# Patient Record
Sex: Male | Born: 1985 | Hispanic: No | Marital: Single | Smoking: Never smoker
Health system: Southern US, Community
[De-identification: ages and names within clinical notes are randomized; demographics above are authoritative.]

## PROBLEM LIST (undated history)

## (undated) DIAGNOSIS — R569 Unspecified convulsions: Secondary | ICD-10-CM

---

## 2016-11-19 ENCOUNTER — Emergency Department
Admission: EM | Admit: 2016-11-19 | Discharge: 2016-11-19 | Disposition: A | Discharge status: Home / Self Care | Payer: Self-pay | Attending: Emergency Medicine | Admitting: Emergency Medicine

## 2016-11-19 ENCOUNTER — Emergency Department: Payer: Self-pay

## 2016-11-19 DIAGNOSIS — R569 Unspecified convulsions: Secondary | ICD-10-CM

## 2016-11-19 DIAGNOSIS — G40909 Epilepsy, unspecified, not intractable, without status epilepticus: Secondary | ICD-10-CM | POA: Insufficient documentation

## 2016-11-19 DIAGNOSIS — F1210 Cannabis abuse, uncomplicated: Secondary | ICD-10-CM | POA: Insufficient documentation

## 2016-11-19 HISTORY — DX: Unspecified convulsions: R56.9

## 2016-11-19 LAB — URINALYSIS, COMPLETE (UACMP) WITH MICROSCOPIC
BACTERIA UA: NONE SEEN
Bilirubin Urine: NEGATIVE
Glucose, UA: NEGATIVE mg/dL
Ketones, ur: 20 mg/dL — AB
Leukocytes, UA: NEGATIVE
Nitrite: NEGATIVE
PROTEIN: 100 mg/dL — AB
RBC / HPF: NONE SEEN RBC/hpf (ref 0–5)
SQUAMOUS EPITHELIAL / LPF: NONE SEEN
Specific Gravity, Urine: 1.016 (ref 1.005–1.030)
pH: 5 (ref 5.0–8.0)

## 2016-11-19 LAB — URINE DRUG SCREEN, QUALITATIVE (ARMC ONLY)
Amphetamines, Ur Screen: NOT DETECTED
BARBITURATES, UR SCREEN: NOT DETECTED
Benzodiazepine, Ur Scrn: POSITIVE — AB
COCAINE METABOLITE, UR ~~LOC~~: NOT DETECTED
Cannabinoid 50 Ng, Ur ~~LOC~~: POSITIVE — AB
MDMA (ECSTASY) UR SCREEN: NOT DETECTED
Methadone Scn, Ur: NOT DETECTED
OPIATE, UR SCREEN: NOT DETECTED
PHENCYCLIDINE (PCP) UR S: NOT DETECTED
Tricyclic, Ur Screen: NOT DETECTED

## 2016-11-19 LAB — CBC WITH DIFFERENTIAL/PLATELET
Basophils Absolute: 0.1 10*3/uL (ref 0–0.1)
Basophils Relative: 1 %
EOS ABS: 0 10*3/uL (ref 0–0.7)
Eosinophils Relative: 0 %
HCT: 42.9 % (ref 40.0–52.0)
HEMOGLOBIN: 14 g/dL (ref 13.0–18.0)
LYMPHS ABS: 5.2 10*3/uL — AB (ref 1.0–3.6)
LYMPHS PCT: 42 %
MCH: 27.9 pg (ref 26.0–34.0)
MCHC: 32.6 g/dL (ref 32.0–36.0)
MCV: 85.5 fL (ref 80.0–100.0)
Monocytes Absolute: 0.8 10*3/uL (ref 0.2–1.0)
Monocytes Relative: 7 %
NEUTROS PCT: 50 %
Neutro Abs: 6.3 10*3/uL (ref 1.4–6.5)
Platelets: 266 10*3/uL (ref 150–440)
RBC: 5.02 MIL/uL (ref 4.40–5.90)
RDW: 13.2 % (ref 11.5–14.5)
WBC: 12.4 10*3/uL — ABNORMAL HIGH (ref 3.8–10.6)

## 2016-11-19 LAB — COMPREHENSIVE METABOLIC PANEL
ALT: 22 U/L (ref 17–63)
AST: 44 U/L — AB (ref 15–41)
Albumin: 4.9 g/dL (ref 3.5–5.0)
Alkaline Phosphatase: 66 U/L (ref 38–126)
Anion gap: 20 — ABNORMAL HIGH (ref 5–15)
BUN: 15 mg/dL (ref 6–20)
CHLORIDE: 106 mmol/L (ref 101–111)
CO2: 10 mmol/L — AB (ref 22–32)
CREATININE: 0.95 mg/dL (ref 0.61–1.24)
Calcium: 9 mg/dL (ref 8.9–10.3)
GFR calc non Af Amer: >60 mL/min (ref >60)
Glucose, Bld: 112 mg/dL — ABNORMAL HIGH (ref 65–99)
POTASSIUM: 3.5 mmol/L (ref 3.5–5.1)
SODIUM: 136 mmol/L (ref 135–145)
Total Bilirubin: 0.7 mg/dL (ref 0.3–1.2)
Total Protein: 8.3 g/dL — ABNORMAL HIGH (ref 6.5–8.1)

## 2016-11-19 LAB — GLUCOSE, CAPILLARY: Glucose-Capillary: 126 mg/dL — ABNORMAL HIGH (ref 65–99)

## 2016-11-19 LAB — ETHANOL: ALCOHOL ETHYL (B): 6 mg/dL — AB (ref <5)

## 2016-11-19 LAB — PHENYTOIN LEVEL, TOTAL

## 2016-11-19 MED ORDER — PHENYTOIN SODIUM EXTENDED 100 MG PO CAPS: 100.0000 mg | ORAL_CAPSULE | Freq: Three times a day (TID) | ORAL | 2 refills | Status: DC

## 2016-11-19 MED ORDER — PHENYTOIN SODIUM EXTENDED 100 MG PO CAPS: 100.0000 mg | ORAL_CAPSULE | Freq: Three times a day (TID) | ORAL | 1 refills | Status: AC

## 2016-11-19 MED ORDER — SODIUM CHLORIDE 0.9 % IV SOLN
1000.0000 mg | Freq: Once | INTRAVENOUS | Status: AC
  Administered 2016-11-19: 1000 mg via INTRAVENOUS
  Filled 2016-11-19: qty 20

## 2016-11-19 MED ORDER — SODIUM CHLORIDE 0.9 % IV BOLUS (SEPSIS)
1000.0000 mL | Freq: Once | INTRAVENOUS | Status: AC
  Administered 2016-11-19: 1000 mL via INTRAVENOUS

## 2016-11-19 NOTE — ED Notes (Signed)
Dr Alphonzo LemmingsMcShane talked to pharmacy about Dilantin

## 2016-11-19 NOTE — ED Triage Notes (Signed)
Pt arrived via ems - he was at work and fell in the kitchen having a seizure - ems witnessed seizure activity and gave 2.5mg  of versed - at this time pt is post dictal but will respond to questions if asked and follow commands - pt has history of seizures but has not been taking medication - pt c/o right sided head pain

## 2016-11-19 NOTE — ED Notes (Signed)
Dr Alphonzo LemmingsMcShane is ok waiting on urine sample until pt is more alert and can void on his own
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
## 2016-11-19 NOTE — ED Notes (Signed)
Iv d'ced.  D/c inst to pt.  Pt signed esignature for discharge instructions.  Pt alert.  Family with pt.

## 2016-11-19 NOTE — ED Notes (Signed)
Pharmacy approved IV tubing to infuse Dilantin

## 2016-11-19 NOTE — ED Provider Notes (Signed)
[facility]  I accepted care from Dr. Alphonzo LemmingsMcShane ____________________________________________    LABS (pertinent positives/negatives)  Labs Reviewed  GLUCOSE, CAPILLARY - Abnormal; Notable for the following:       Result Value   Glucose-Capillary 126 (*)    All other components within normal limits  ETHANOL - Abnormal; Notable for the following:    Alcohol, Ethyl (B) 6 (*)    All other components within normal limits  COMPREHENSIVE METABOLIC PANEL - Abnormal; Notable for the following:    CO2 10 (*)    Glucose, Bld 112 (*)    Total Protein 8.3 (*)    AST 44 (*)    Anion gap 20 (*)    All other components within normal limits  CBC WITH DIFFERENTIAL/PLATELET - Abnormal; Notable for the following:    WBC 12.4 (*)    Lymphs Abs 5.2 (*)    All other components within normal limits  URINE DRUG SCREEN, QUALITATIVE ([facility] ONLY) - Abnormal; Notable for the following:    Cannabinoid 50 Ng, Ur [place] POSITIVE (*)    Benzodiazepine, Ur Scrn POSITIVE (*)    All other components within normal limits  URINALYSIS, COMPLETE (UACMP) WITH MICROSCOPIC - Abnormal; Notable for the following:    Color, Urine YELLOW (*)    APPearance CLEAR (*)    Hgb urine dipstick SMALL (*)    Ketones, ur 20 (*)    Protein, ur 100 (*)    All other components within normal limits  PHENYTOIN LEVEL, TOTAL - Abnormal; Notable for the following:    Phenytoin Lvl <2.5 (*)    All other components within normal limits       ____________________________________________   PROCEDURES  Procedure(s) performed: None  Critical Care performed: None  ____________________________________________   INITIAL IMPRESSION / ASSESSMENT AND PLAN / ED COURSE   Pertinent labs & imaging results that were available during my care of the patient were reviewed by me and considered in my medical decision making (see chart for details).  I was signed out this patient, awaiting urinalysis and urine drug screen.  Patient had received a loading dose of Dilantin and printed prescription to start on Dilantin by Dr. Alphonzo LemmingsMcShane. Patient will be referred both for neurology as well as primary care follow-up.  I did discuss with him urine drug screen positive for benzodiazepine as well as cannabis and to avoid these substances as they can lower seizure threshold.  We also discussed seizure precautions.  CONSULTATIONS: None   Patient / Family / Caregiver informed of clinical course, medical decision-making process, and agree with plan.   I discussed return precautions, follow-up instructions, and discharged instructions with patient and/or family.     ____________________________________________   FINAL CLINICAL IMPRESSION(S) / ED DIAGNOSES  Final diagnoses:  Seizure (HCC)  Cannabis abuse        Governor Rooksebecca Michaline Kindig, MD 11/19/16 1737

## 2016-11-19 NOTE — ED Provider Notes (Addendum)
[facility] Emergency Department Provider Note  ____________________________________________   I have reviewed the triage vital signs and the nursing notes.   HISTORY  Chief Complaint Seizures    HPI Jacob Hines is a 31 y.o. male who presents today complaining of having had a seizure. According to EMS patient does have a known history of seizures. Patient is able to tell me that much himself. He does state that he supposed to be taking Dilantin but not taking it. He denies any alcohol abuse. Very limited history as he is very sleepy and had Versed prior to coming in. It was a witnessed grand mal seizure. Some evidence of that he did hit his head is present. Patient was complaining of EMS for headaches but he states he is not having one at this time to me.       Past Medical History:  Diagnosis Date  . Seizures (HCC)     There are no active problems to display for this patient.   History reviewed. No pertinent surgical history.  Prior to Admission medications   Not on File    Allergies Patient has no known allergies.  No family history on file.  Social History Social History  Substance Use Topics  . Smoking status: Never Smoker  . Smokeless tobacco: Never Used  . Alcohol use No    Review of Systems Level 5 chart caveat; no further history available due to patient status. ____________________________________________   PHYSICAL EXAM:  VITAL SIGNS: ED Triage Vitals  Enc Vitals Group     BP 11/19/16 1337 119/64     Pulse Rate 11/19/16 1337 (!) 116     Resp 11/19/16 1337 (!) 22     Temp 11/19/16 1337 98.4 F (36.9 C)     Temp Source 11/19/16 1337 Axillary     SpO2 11/19/16 1337 96 %     Weight 11/19/16 1328 140 lb (63.5 kg)     Height 11/19/16 1328 5\' 4"  (1.626 m)     Head Circumference --      Peak Flow --      Pain Score 11/19/16 1328 6     Pain Loc --      Pain Edu? --      Excl. in GC? --     Constitutional: Low but  able to answer questions limitedly. Appears postictal. Well appearing and in no acute distress. Eyes: Conjunctivae are normal. PERRL. EOMI. Head: Positive scalp hematoma, no bleeding laceration. Nose: No congestion/rhinnorhea. Mouth/Throat: Mucous membranes are moist.  Oropharynx non-erythematous. Neck: No stridor.   Nontender with no meningismus Cardiovascular: Mild tachycardia noted, regular rhythm. Grossly normal heart sounds.  Good peripheral circulation. Respiratory: Normal respiratory effort.  No retractions. Lungs CTAB. Abdominal: Soft and nontender. No distention. No guarding no rebound Back:  There is no focal tenderness or step off.  there is no midline tenderness there are no lesions noted. there is no CVA tenderness Musculoskeletal: No lower extremity tenderness, no upper extremity tenderness. No joint effusions, no DVT signs strong distal pulses no edema Neurologic:  No obvious focality to his exam the patient is noncompliant with exam. Skin:  Skin is warm, dry and intact. No rash noted. Psychiatric: Mood and affect are normal. Speech and behavior are normal.  ____________________________________________   LABS (all labs ordered are listed, but only abnormal results are displayed)  Labs Reviewed  GLUCOSE, CAPILLARY - Abnormal; Notable for the following:       Result Value   Glucose-Capillary  126 (*)    All other components within normal limits  ETHANOL - Abnormal; Notable for the following:    Alcohol, Ethyl (B) 6 (*)    All other components within normal limits  COMPREHENSIVE METABOLIC PANEL - Abnormal; Notable for the following:    CO2 10 (*)    Glucose, Bld 112 (*)    Total Protein 8.3 (*)    AST 44 (*)    Anion gap 20 (*)    All other components within normal limits  CBC WITH DIFFERENTIAL/PLATELET - Abnormal; Notable for the following:    WBC 12.4 (*)    Lymphs Abs 5.2 (*)    All other components within normal limits  URINE DRUG SCREEN, QUALITATIVE ([facility] ONLY)   URINALYSIS, COMPLETE (UACMP) WITH MICROSCOPIC  PHENYTOIN LEVEL, TOTAL   ____________________________________________  EKG  I personally interpreted any EKGs ordered by me or triage Sinus tachycardia rate 133 bpm, no acute ST elevation or depression. ____________________________________________  RADIOLOGY  I reviewed any imaging ordered by me or triage that were performed during my shift and, if possible, patient and/or family made aware of any abnormal findings. ____________________________________________   PROCEDURES  Procedure(s) performed: None  Procedures  Critical Care performed: None  ____________________________________________   INITIAL IMPRESSION / ASSESSMENT AND PLAN / ED COURSE  Pertinent labs & imaging results that were available during my care of the patient were reviewed by me and considered in my medical decision making (see chart for details).  Patient here with seizure, known seizure history. Dilantin levels pending. Did get Versed. We'll need further evaluation as he wakes up. Because he apparently just moved here from a fall and I have no idea if he's ever had any imaging for his seizure disorder we did do a CT scan of the head especially as he has some evidence of head injury, and CT is reassuringly negative and I did discuss with radiology. Patient therefore is at this time awaiting his Dilantin level which we will supplement as needed. I do not think he is a position to give me a reliable history and I would need to give him Dilantin pending his level. His blood work betray some degree of acidemia which is not unusual for a postictal period. We will give him IV fluids, and we'll continue to observe him.   ----------------------------------------- 2:32 PM on 11/19/2016 -----------------------------------------  Patient is awake and alert using his cell phone in no acute distress neurologically intact, he states he used to be on Dilantin but has not  taken it for a year. We will peripherally therefore Dilantin load him. We will continue to observe him here until after he is restarted on Dilantin and we'll have him follow-up with neurology. Patient very comfortable with this. Counseled the patient not to drive, soak in the tub, or do any other activities which, if interrupted by seizure, can cause him and other people harm and he understands and agrees.   ----------------------------------------- 3:06 PM on 11/19/2016 -----------------------------------------  Signed out to dr. Shaune Pollack at the end of my shift pending dilantin load. Pt in nad laughing and joking in the room.     ____________________________________________   FINAL CLINICAL IMPRESSION(S) / ED DIAGNOSES  Final diagnoses:  None      This chart was dictated using voice recognition software.  Despite best efforts to proofread,  errors can occur which can change meaning.      Jeanmarie Plant, MD 11/19/16 1425    Jeanmarie Plant, MD 11/19/16  1433    Jeanmarie PlantJames A McShane, MD 11/19/16 [phone]

## 2016-11-19 NOTE — Discharge Instructions (Signed)
Please follow up with a primary care doctor as well as neurologist. Dr. Alphonzo LemmingsMcShane has started back on Dilantin. You will need to be rechecked for this to make sure the dose is correct. Avoid any illegal substances including marijuana as this can lower the seizure threshold and make it more likely for you to have a seizure.  No driving or operating machinery, unattended swimming or baths, or climbing to heights until seen and cleared by neurologist.

## 2017-11-05 IMAGING — CT CT HEAD W/O CM
3 series · 15 of 47 positions shown, 18 images · non-contrast
Comparison: None.

CLINICAL DATA: Seizure with fall

EXAM:
CT HEAD WITHOUT CONTRAST
TECHNIQUE: Contiguous axial images were obtained from the base of the skull
through the vertex without intravenous contrast.

[Series 2: head wo · axial · 0.43mm/px · z∈[+87,+217]mm · 9 of 32 slices shown, 12 images]
[im 3/32  brain]
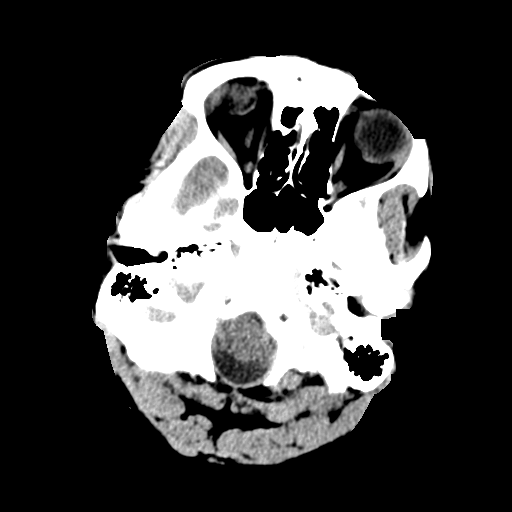
[im 3/32  bone]
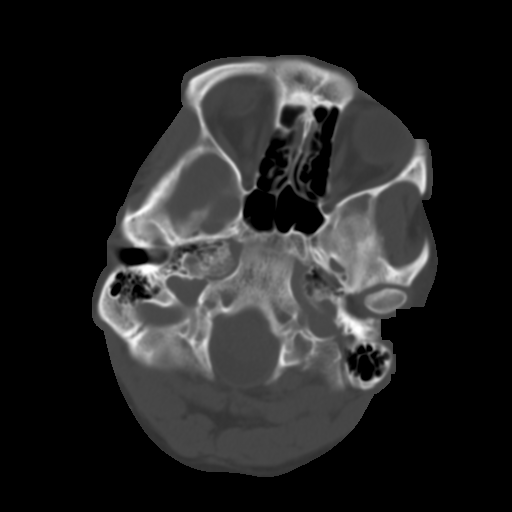
[im 6/32  brain]
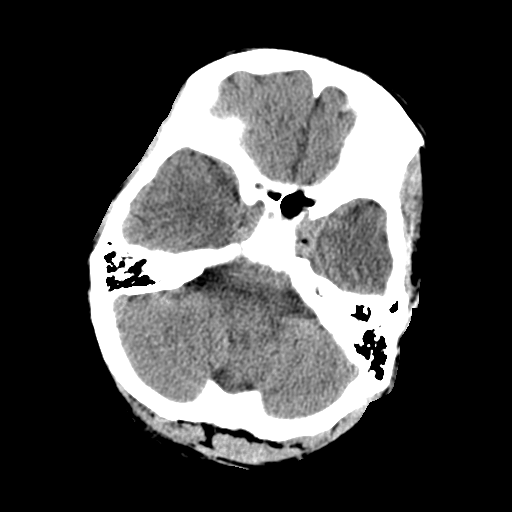
[im 9/32  brain]
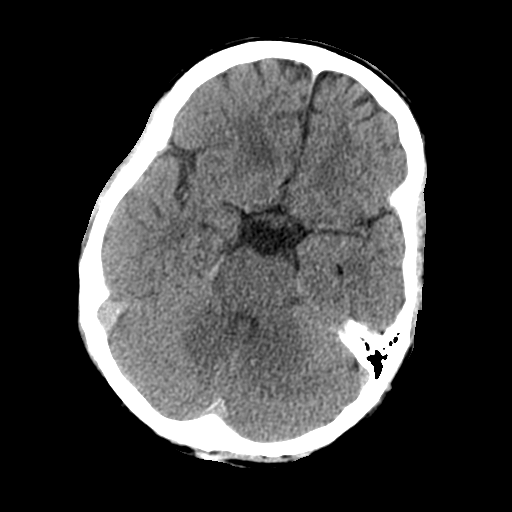
[im 12/32  brain]
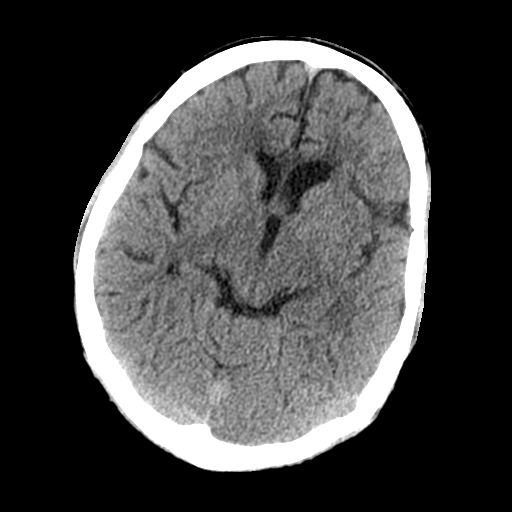
[im 17/32  brain]
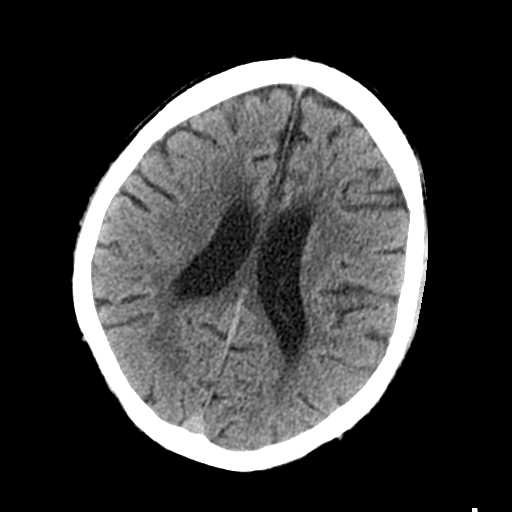
[im 17/32  bone]
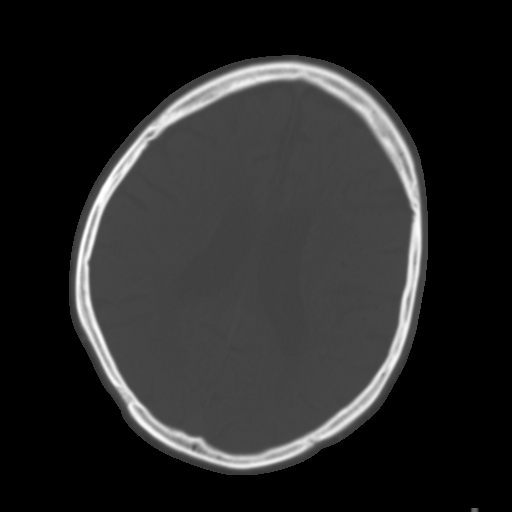
[im 20/32  brain]
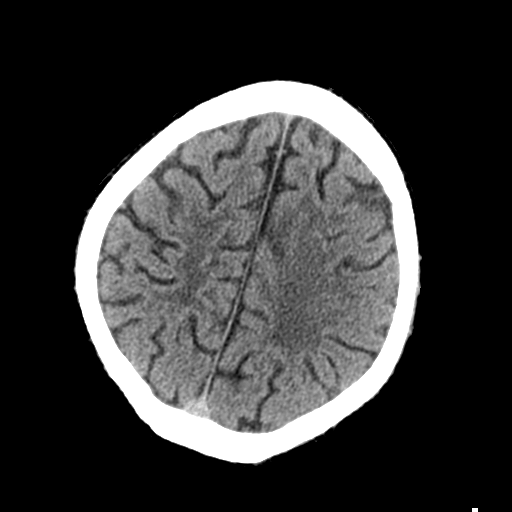
[im 23/32  brain]
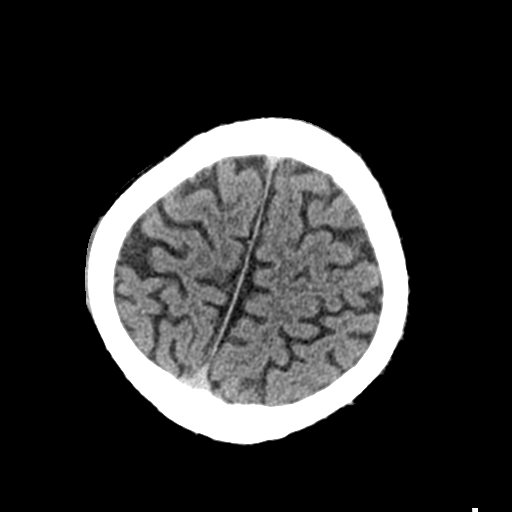
[im 26/32  brain]
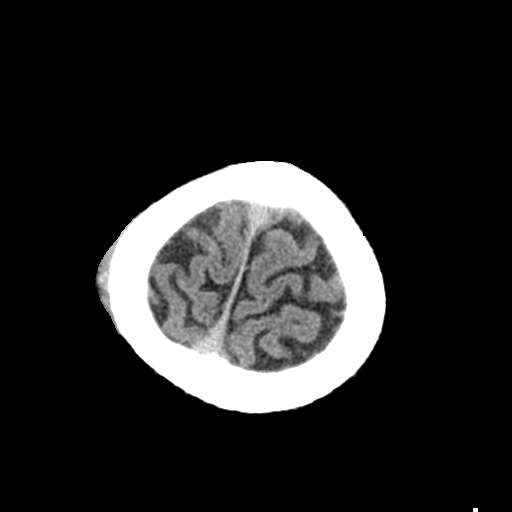
[im 29/32  brain]
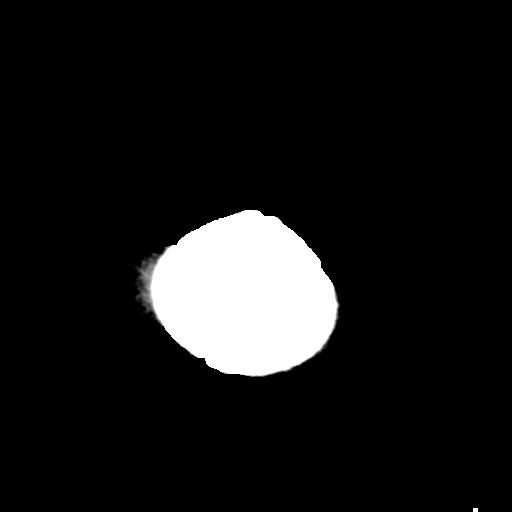
[im 29/32  bone]
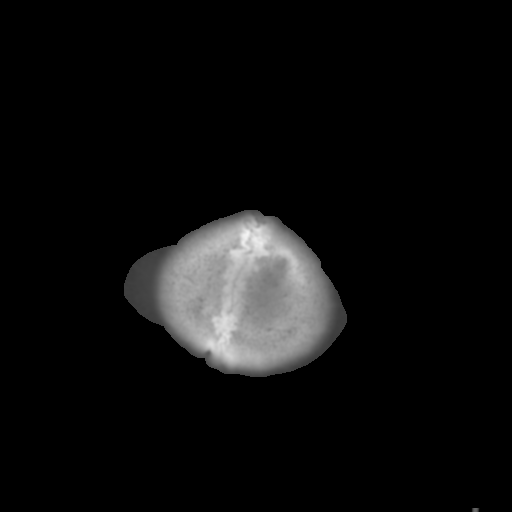

[Series 4: coronal soft tissue · coronal · 0.33mm/px · 3 of 67 slices shown]
[im 23/67  brain]
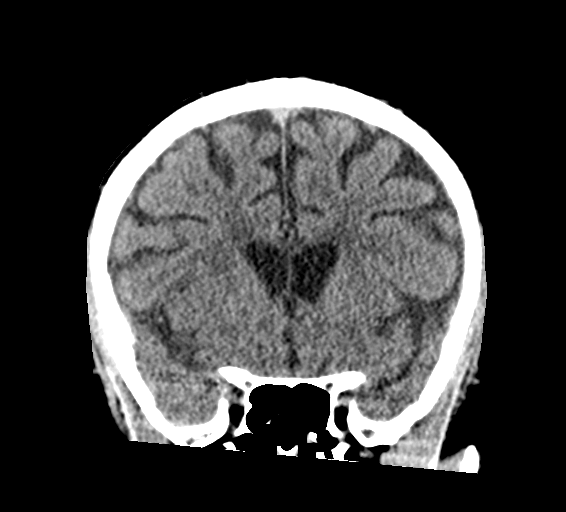
[im 30/67  brain]
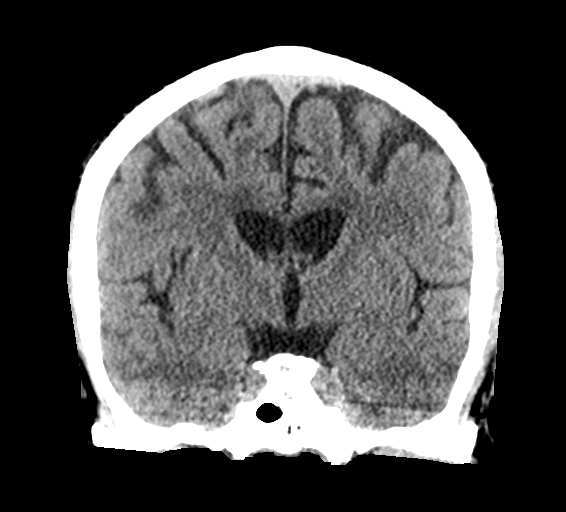
[im 37/67  brain]
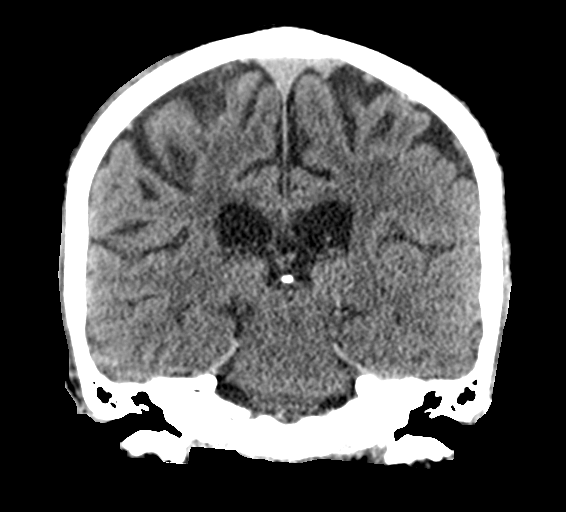

[Series 5: sagittal soft tissue · sagittal · 0.34mm/px · 3 of 56 slices shown]
[im 19/56  brain]
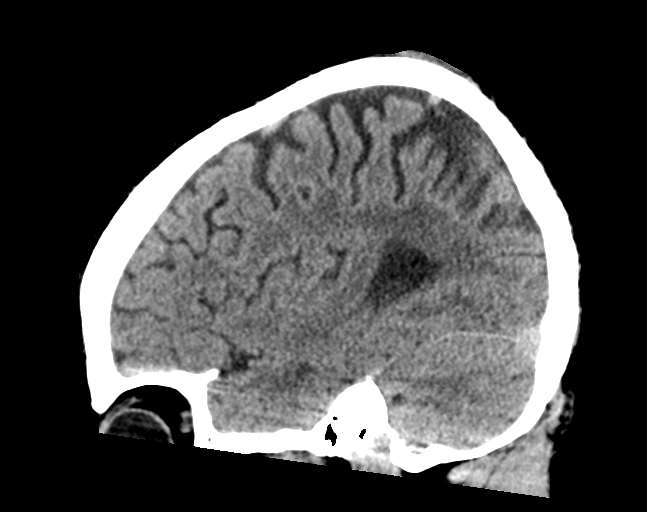
[im 28/56  brain]
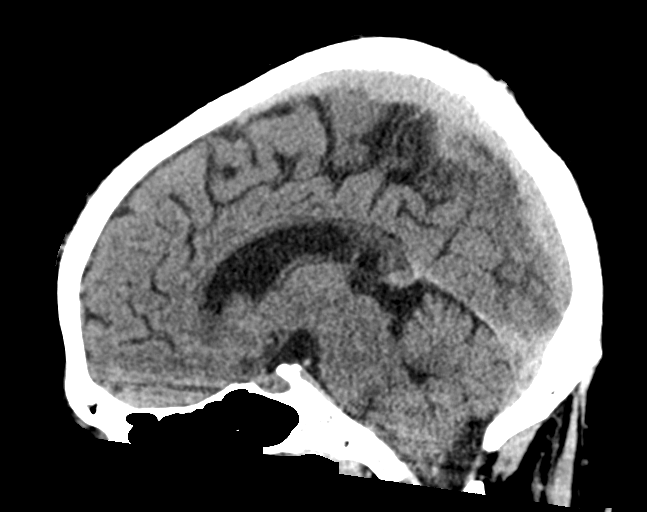
[im 37/56  brain]
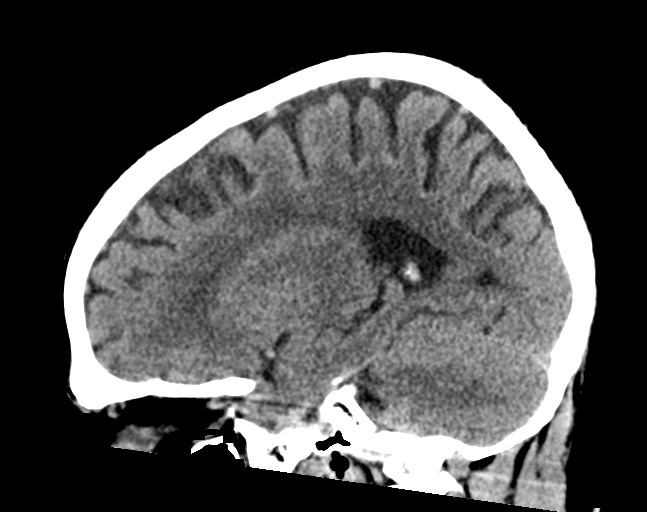

[15 of 47 positions shown; findings below may reference images not displayed]

FINDINGS: Brain: There is borderline generalized atrophy for age. There is no
intracranial mass, hemorrhage, extra-axial fluid collection, or
midline shift. Gray-white compartments appear normal. No evident
acute infarct.

Vascular: No hyperdense vessel.  No evident vascular calcification.

Skull: The bony calvarium appears intact. There is a high
frontoparietal region scalp hematoma near the vertex on the right.

Sinuses/Orbits: There is mucosal thickening in multiple ethmoid air
cells bilaterally. Visualized paranasal sinuses elsewhere clear.
Note that frontal sinuses are aplastic. Visualized orbits appear
symmetric bilaterally.

Other: Mastoid air cells are clear.
IMPRESSION: Borderline generalized atrophy for age. No intracranial mass,
hemorrhage, or extra-axial fluid collection. Gray-white compartments
are normal.

High right frontal parietal scalp hematoma near the vertex. No
fracture evident.

Areas of ethmoid sinus disease.  Frontal sinuses are aplastic.
